# Patient Record
Sex: Male | Born: 1960 | Race: White | Hispanic: No | Marital: Married | Smoking: Never smoker
Health system: Southern US, Community
[De-identification: ages and names within clinical notes are randomized; demographics above are authoritative.]

## PROBLEM LIST (undated history)

## (undated) DIAGNOSIS — Z803 Family history of malignant neoplasm of breast: Secondary | ICD-10-CM

## (undated) DIAGNOSIS — E079 Disorder of thyroid, unspecified: Secondary | ICD-10-CM

## (undated) DIAGNOSIS — D2330 Other benign neoplasm of skin of unspecified part of face: Secondary | ICD-10-CM

## (undated) DIAGNOSIS — Z8042 Family history of malignant neoplasm of prostate: Secondary | ICD-10-CM

## (undated) HISTORY — DX: Family history of malignant neoplasm of prostate: Z80.42

## (undated) HISTORY — DX: Other benign neoplasm of skin of unspecified part of face: D23.30

## (undated) HISTORY — DX: Family history of malignant neoplasm of breast: Z80.3

---

## 2002-05-08 ENCOUNTER — Ambulatory Visit (HOSPITAL_BASED_OUTPATIENT_CLINIC_OR_DEPARTMENT_OTHER): Admission: RE | Admit: 2002-05-08 | Discharge: 2002-05-08 | Payer: Self-pay | Admitting: Orthopedic Surgery

## 2006-10-19 ENCOUNTER — Ambulatory Visit: Payer: Self-pay | Admitting: Sports Medicine

## 2006-10-19 DIAGNOSIS — M7750 Other enthesopathy of unspecified foot: Secondary | ICD-10-CM | POA: Insufficient documentation

## 2008-09-22 ENCOUNTER — Ambulatory Visit (HOSPITAL_COMMUNITY): Admission: RE | Admit: 2008-09-22 | Discharge: 2008-09-22 | Payer: Self-pay | Admitting: Sports Medicine

## 2008-09-24 ENCOUNTER — Encounter: Admission: RE | Admit: 2008-09-24 | Discharge: 2008-09-24 | Payer: Self-pay | Admitting: Family Medicine

## 2010-05-27 NOTE — Op Note (Signed)
NAME:  Jerry Hunter, Jerry Hunter                        ACCOUNT NO.:  [id number]   MEDICAL RECORD NO.:  [id number]                   PATIENT TYPE:  AMB   LOCATION:  DSC                                  FACILITY:  [facility]   PHYSICIAN:  Katy Fitch. Naaman Plummer., M.D.          DATE OF BIRTH:  1960-02-16   DATE OF PROCEDURE:  05/08/2002  DATE OF DISCHARGE:                                 OPERATIVE REPORT   PREOPERATIVE DIAGNOSIS:  Laceration, radial aspect of left index finger, on  05/04/02, with loss of sensibility of radial pulp, left index finger.   POSTOPERATIVE DIAGNOSIS:  Partial laceration of radial proper digital nerve  proximal to trifurcation and laceration of cutaneous branch along middle  phalangeal segment, left index finger.   PROCEDURE:  Exploration of left index finger radial proper digital nerve,  followed by repair of cutaneous branch and sidewall laceration of radial  proper digital nerve.   SURGEON:  Katy Fitch. Sypher, M.D.   ASSISTANT:  Jonni Sanger, P.A.   ANESTHESIA:  General by LMA, supervised by the anesthesiologist, Janetta Hora.  Gelene Mink, M.D.   INDICATIONS:  The patient is a 50 year old who was referred through the  courtesy of Pleas Patricia, M.D., for evaluation of a left index finger  laceration sustained accidentally at home on 05/04/02.   He had sustained a laceration over the middle phalangeal segment radial  aspect of left index finger and noted loss of sensibility.   The patient, being familiar with Dr. Stephens November, sought a consultation.   Dr. Stephens November cleansed the wound and repaired the wound with Steri-Strips.   Noting loss of sensibility in the radial proper digital nerve distribution,  a hand surgery consultation was requested.   On examination in the office the patient was noted to have a positive Tinel  sign at the site of his laceration and loss of sensibility to sharp, dull,  and two-point discrimination in the radial pulp.  We recommended  delayed  primary repair at this time.   DESCRIPTION OF PROCEDURE:  The patient is brought to the operating room and  placed in the supine position on the operating table.  Following induction  of general endotracheal anesthesia, the left arm was prepped with Betadine  soap and solution and sterilely draped.  A pneumatic tourniquet was applied  to the proximal brachium.   Following exsanguination of the left arm with an Esmarch bandage, an  arterial tourniquet on the proximal brachium was inflated to 230 mmHg.   The procedure commenced with extension of the traumatic oblique wound into a  Brunner zigzag incision, crossing the DIP flexion crease.   Subcutaneous tissues were carefully divided, identifying the flexor sheath.  Several cutaneous veins were electrocauterized.  The radial proper digital  nerve was identified proximally in virgin tissue and dissected distally.   There was a sidewall laceration of the radial proper digital nerve with  involvement of a large  fascicle.  There was a large approximately 1 mm  cutaneous branch that was laying radial to the radial proper digital nerve  proper that was clearly lacerated.   There appeared to be a high takeoff of the branch that served the radial  pulp rather than the central pulp.   With the aid of the operating microscope, the radial proper digital nerve  proper was repaired with epineurial suture of 9-0 nylon.  The cutaneous  branch was approximated, trimmed on the neuroma end, and repaired with two  sutures of 9-0 nylon with epineurial technique.   The wound was irrigated and repaired with interrupted sutures of 5-0 nylon.   There were no apparent complications.   The finger was dressed with Xeroflo, sterile gauze, and an Alumafoam splint  to protect the repair.   There were no apparent complications.   The patient was awakened from anesthesia and transferred to the recovery  room with stable vital signs.                                                Katy Fitch Naaman Plummer., M.D.    RVS/MEDQ  D:  05/08/2002  T:  05/09/2002  Job:  [id number]

## 2015-08-22 ENCOUNTER — Emergency Department (HOSPITAL_COMMUNITY): Payer: BLUE CROSS/BLUE SHIELD

## 2015-08-22 ENCOUNTER — Emergency Department (HOSPITAL_COMMUNITY)
Admission: EM | Admit: 2015-08-22 | Discharge: 2015-08-22 | Disposition: A | Discharge status: Home / Self Care | Payer: BLUE CROSS/BLUE SHIELD | Attending: Emergency Medicine | Admitting: Emergency Medicine

## 2015-08-22 ENCOUNTER — Encounter (HOSPITAL_COMMUNITY): Payer: Self-pay | Admitting: Emergency Medicine

## 2015-08-22 DIAGNOSIS — M545 Low back pain, unspecified: Secondary | ICD-10-CM

## 2015-08-22 DIAGNOSIS — R52 Pain, unspecified: Secondary | ICD-10-CM

## 2015-08-22 MED ORDER — CYCLOBENZAPRINE HCL 10 MG PO TABS
10.0000 mg | ORAL_TABLET | Freq: Once | ORAL | Status: AC
  Administered 2015-08-22: 10 mg via ORAL
  Filled 2015-08-22: qty 1

## 2015-08-22 MED ORDER — CYCLOBENZAPRINE HCL 10 MG PO TABS: 10.0000 mg | ORAL_TABLET | Freq: Two times a day (BID) | ORAL | 0 refills | Status: DC | PRN

## 2015-08-22 MED ORDER — IBUPROFEN 800 MG PO TABS: 800.0000 mg | ORAL_TABLET | Freq: Three times a day (TID) | ORAL | 0 refills | Status: DC

## 2015-08-22 MED ORDER — KETOROLAC TROMETHAMINE 30 MG/ML IJ SOLN
30.0000 mg | Freq: Once | INTRAMUSCULAR | Status: AC
  Administered 2015-08-22: 30 mg via INTRAVENOUS
  Filled 2015-08-22: qty 1

## 2015-08-22 NOTE — ED Provider Notes (Signed)
Emergency Department Provider Note   I have reviewed the triage vital signs and the nursing notes.   HISTORY  Chief Complaint Back Pain   HPI QAMAR BRAGA is a 55 y.o. male with PMH of lower back pain presents to the emergency department with sudden onset lower back discomfort in the setting of moving heavy objects around the attic. Patient states he was bending forward while carrying a bike trainer when he had sudden onset severe pain that he describes just to the right of his lower back and coccyx. He has had similar pain in this location before. Denies radiation of pain down the leg. No weakness or numbness in the lower extremities. Patient has had no urinary incontinence or difficulty urinating. No groin numbness. No fevers or midline back pain. Patient with a remote history of T spine compression fractures.    History reviewed. No pertinent past medical history.  Patient Active Problem List   Diagnosis Date Noted  . METATARSALGIA 10/19/2006    History reviewed. No pertinent surgical history.  Current Outpatient Rx  . Order #: [id number] Class: Historical Med  . Order #: [id number] Class: Historical Med  . Order #: [id number] Class: Print  . Order #: [id number] Class: Print    Allergies Review of patient's allergies indicates no known allergies.  No family history on file.  Social History Social History  Substance Use Topics  . Smoking status: Never Smoker  . Smokeless tobacco: Never Used  . Alcohol use No    Review of Systems  Constitutional: No fever/chills Eyes: No visual changes. ENT: No sore throat. Cardiovascular: Denies chest pain. Respiratory: Denies shortness of breath. Gastrointestinal: No abdominal pain.  No nausea, no vomiting.  No diarrhea.  No constipation. Genitourinary: Negative for dysuria. Musculoskeletal: Positive for back pain.  Skin: Negative for rash. Neurological: Negative for headaches, focal weakness or numbness.  10-point ROS  otherwise negative.  ____________________________________________   PHYSICAL EXAM:  VITAL SIGNS: ED Triage Vitals [08/22/15 1032]  Enc Vitals Group     BP 109/72     Pulse Rate (!) 55     Resp 18     Temp 97.7 F (36.5 C)     Temp Source Oral     SpO2 99 %     Pain Score 8   Constitutional: Alert and oriented. Well appearing and in no acute distress. Eyes: Conjunctivae are normal. PERRL. EOMI. Head: Atraumatic. Nose: No congestion/rhinnorhea. Mouth/Throat: Mucous membranes are moist.  Oropharynx non-erythematous. Neck: No stridor.  No meningeal signs. No cervical spine tenderness to palpation Cardiovascular: Normal rate, regular rhythm. Good peripheral circulation. Grossly normal heart sounds.   Respiratory: Normal respiratory effort.  No retractions. Lungs CTAB. Gastrointestinal: Soft and nontender. No distention.  Musculoskeletal: No lower extremity tenderness nor edema. No gross deformities of extremities. No midline spine tenderness. Mild tenderness to palpation along the right lateral spine.  Neurologic:  Normal speech and language. No gross focal neurologic deficits are appreciated.  Skin:  Skin is warm, dry and intact. No rash noted. Psychiatric: Mood and affect are normal. Speech and behavior are normal.  ____________________________________________  RADIOLOGY  Dg Lumbar Spine Complete  Result Date: 08/22/2015 CLINICAL DATA:  Low back pain after lifting injury this morning. EXAM: LUMBAR SPINE - COMPLETE 4+ VIEW COMPARISON:  None. FINDINGS: This report assumes 5 non rib-bearing lumbar vertebrae. Lumbar vertebral body heights are preserved, with no fracture. Lumbar disc heights are preserved. No spondylosis. No spondylolisthesis. No appreciable facet arthropathy. No aggressive appearing focal osseous lesions.  Right upper quadrant surgical clips. IMPRESSION: No lumbar spine fracture or spondylolisthesis. Electronically Signed   By: Ilona Sorrel M.D.   On: 08/22/2015 11:47      ____________________________________________   PROCEDURES  Procedure(s) performed:   Procedures  None ____________________________________________   INITIAL IMPRESSION / ASSESSMENT AND PLAN / ED COURSE  Pertinent labs & imaging results that were available during my care of the patient were reviewed by me and considered in my medical decision making (see chart for details).  Patient resents to the emergency department for evaluation of acute onset lower back pain in the setting of lifting. No red flag symptoms to make me suspicious for cauda equina or other spinal cord emergency. The patient has an intact neurological exam with some mild tenderness to his right lateral lumbar spine. Low suspicion for fracture. Plan for plain films of the lumbar spine and will give Toradol and Flexeril for pain control. Suspect musculoskeletal etiology. Discussed this with the patient in detail.   12:19 PM Patient pain somewhat improved. X-ray negative. Plan for discharge home with Motrin and Flexeril. Advised early walking and movement. He will follow with his primary care physician. Discussed return precautions in detail and answered any questions. ____________________________________________  FINAL CLINICAL IMPRESSION(S) / ED DIAGNOSES  Final diagnoses:  Right-sided low back pain without sciatica     MEDICATIONS GIVEN DURING THIS VISIT:  Medications  ketorolac (TORADOL) 30 MG/ML injection 30 mg (30 mg Intravenous Given 08/22/15 1121)  cyclobenzaprine (FLEXERIL) tablet 10 mg (10 mg Oral Given 08/22/15 1121)     NEW OUTPATIENT MEDICATIONS STARTED DURING THIS VISIT:  New Prescriptions   CYCLOBENZAPRINE (FLEXERIL) 10 MG TABLET    Take 1 tablet (10 mg total) by mouth 2 (two) times daily as needed for muscle spasms.   IBUPROFEN (ADVIL,MOTRIN) 800 MG TABLET    Take 1 tablet (800 mg total) by mouth 3 (three) times daily.      Note:  This document was prepared using Dragon voice  recognition software and may include unintentional dictation errors.  Nanda Quinton, MD Emergency Medicine   Margette Fast, MD 08/22/15 [phone]

## 2015-08-22 NOTE — ED Triage Notes (Signed)
Patient here from home with complaints of lower back pain. Reports cleaning out attic, heard a loud pop when bending over. Given 150 Fent, 4mg  Zofran.

## 2015-08-22 NOTE — Discharge Instructions (Signed)
You have been seen in the Emergency Department (ED)  today for back pain.  Your workup and exam have not shown any acute abnormalities and you are likely suffering from muscle strain or possible problems with your discs, but there is no treatment that will fix your symptoms at this time.  Please take Motrin (ibuprofen) as needed for your pain according to the instructions written on the box.  Alternatively, for the next five days you can take 800mg three times daily with meals (it may upset your stomach).   Please follow up with your doctor as soon as possible regarding today's ED visit and your back pain.  Return to the ED for worsening back pain, fever, weakness or numbness of either leg, or if you develop either (1) an inability to urinate or have bowel movements, or (2) loss of your ability to control your bathroom functions (if you start having "accidents"), or if you develop other new symptoms that concern you.  

## 2017-04-27 IMAGING — CR DG LUMBAR SPINE COMPLETE 4+V
5 series · 5 of 5 positions shown · non-contrast
Comparison: None.

CLINICAL DATA: Low back pain after lifting injury this morning.

EXAM:
LUMBAR SPINE - COMPLETE 4+ VIEW

[t lumbar spine ap (1 of 2)]
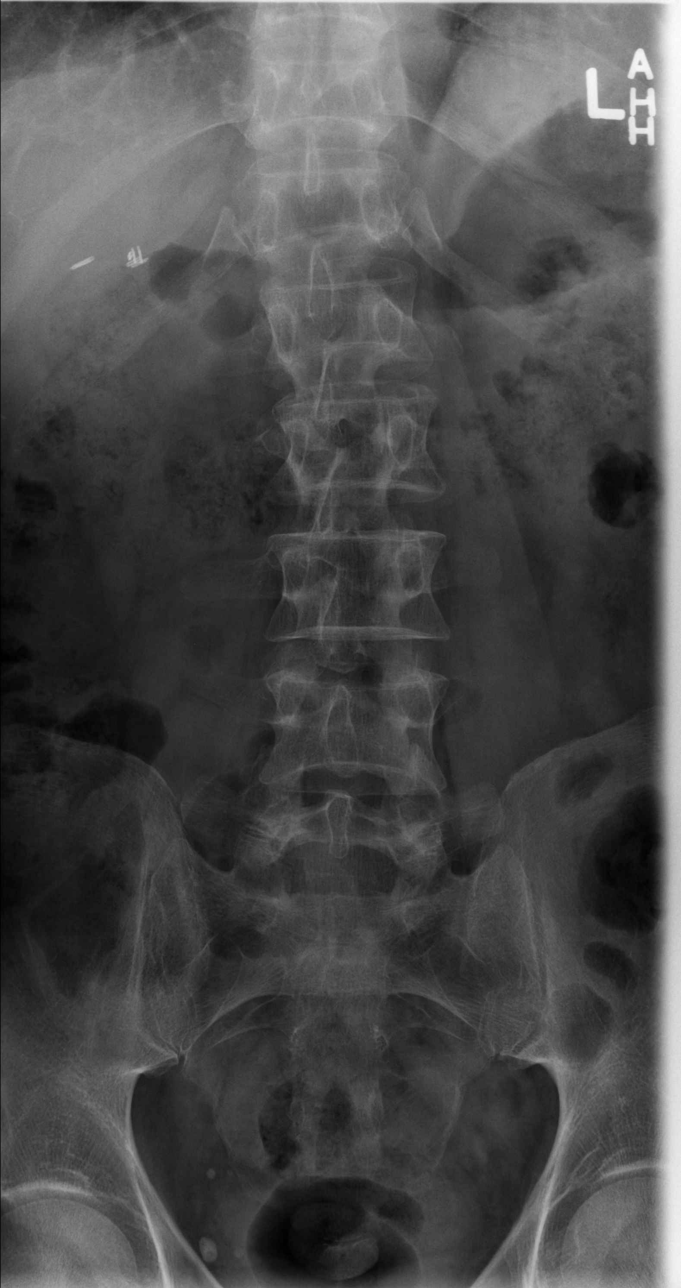

[t lumbar spine ap (2 of 2)]
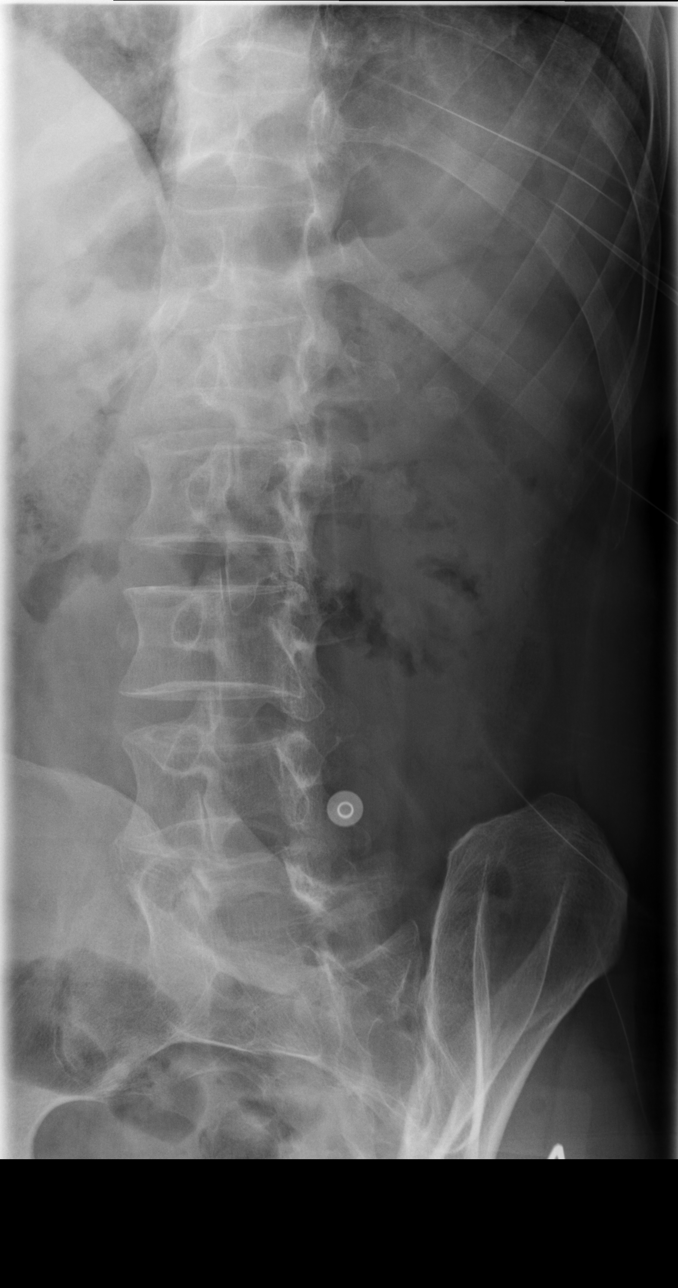

[t lumbar spine lat (1 of 2)]
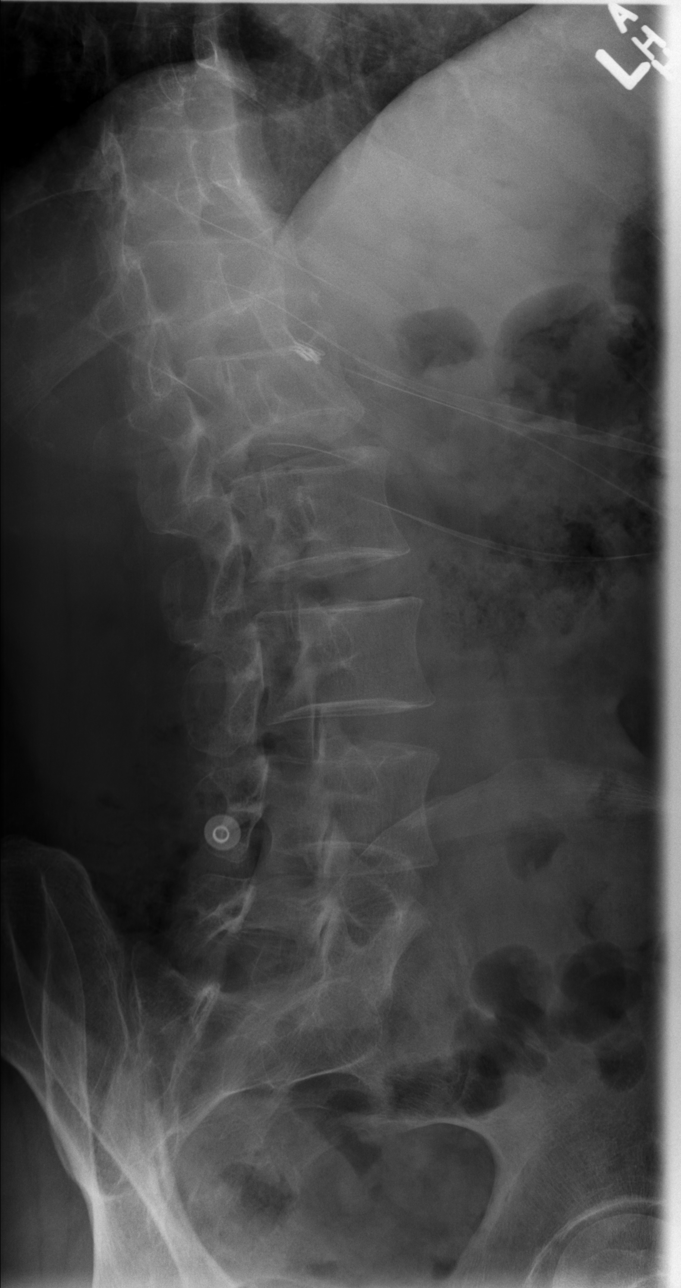

[t lumbar spine lat (2 of 2)]
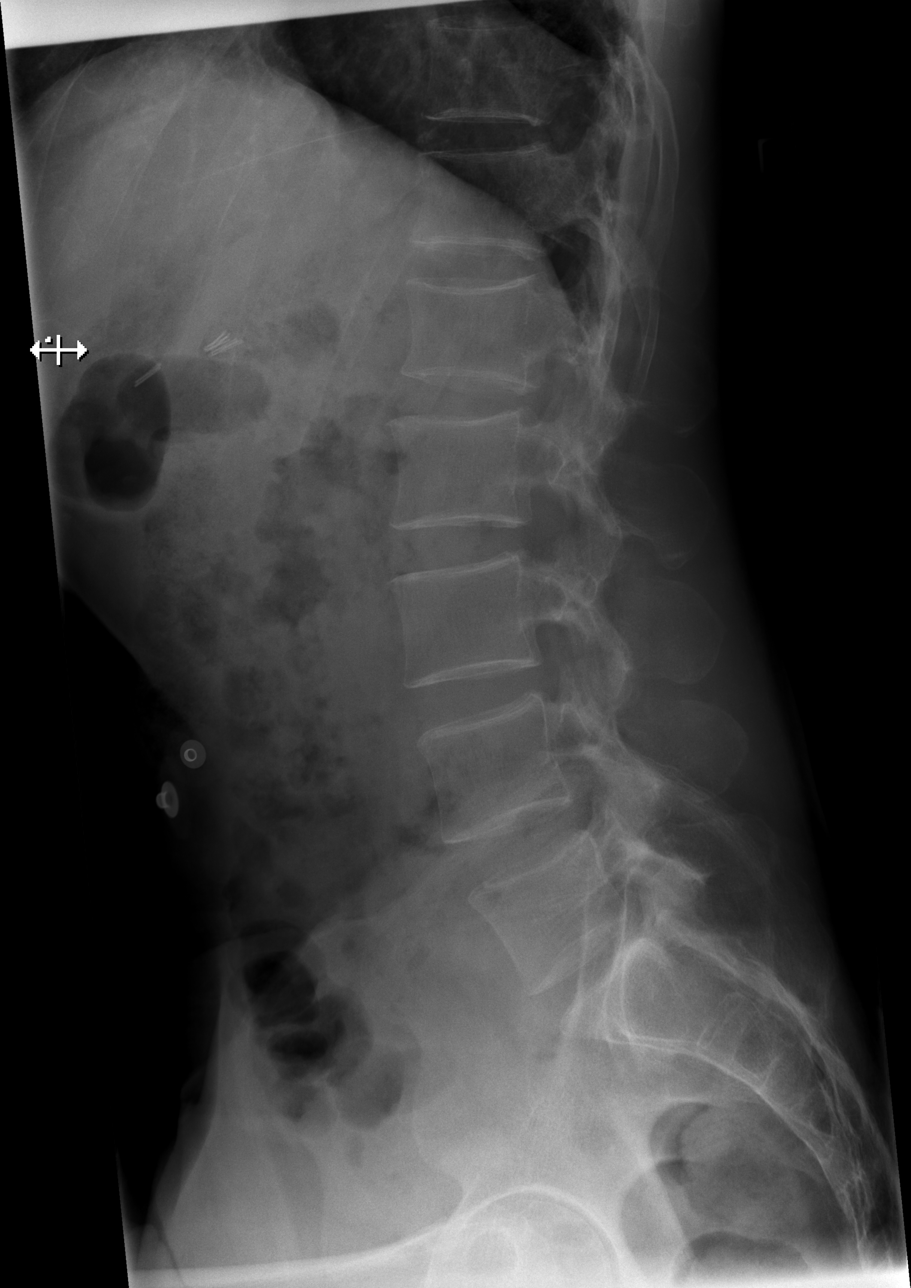

[t lumbar l-5 s-1 spot]
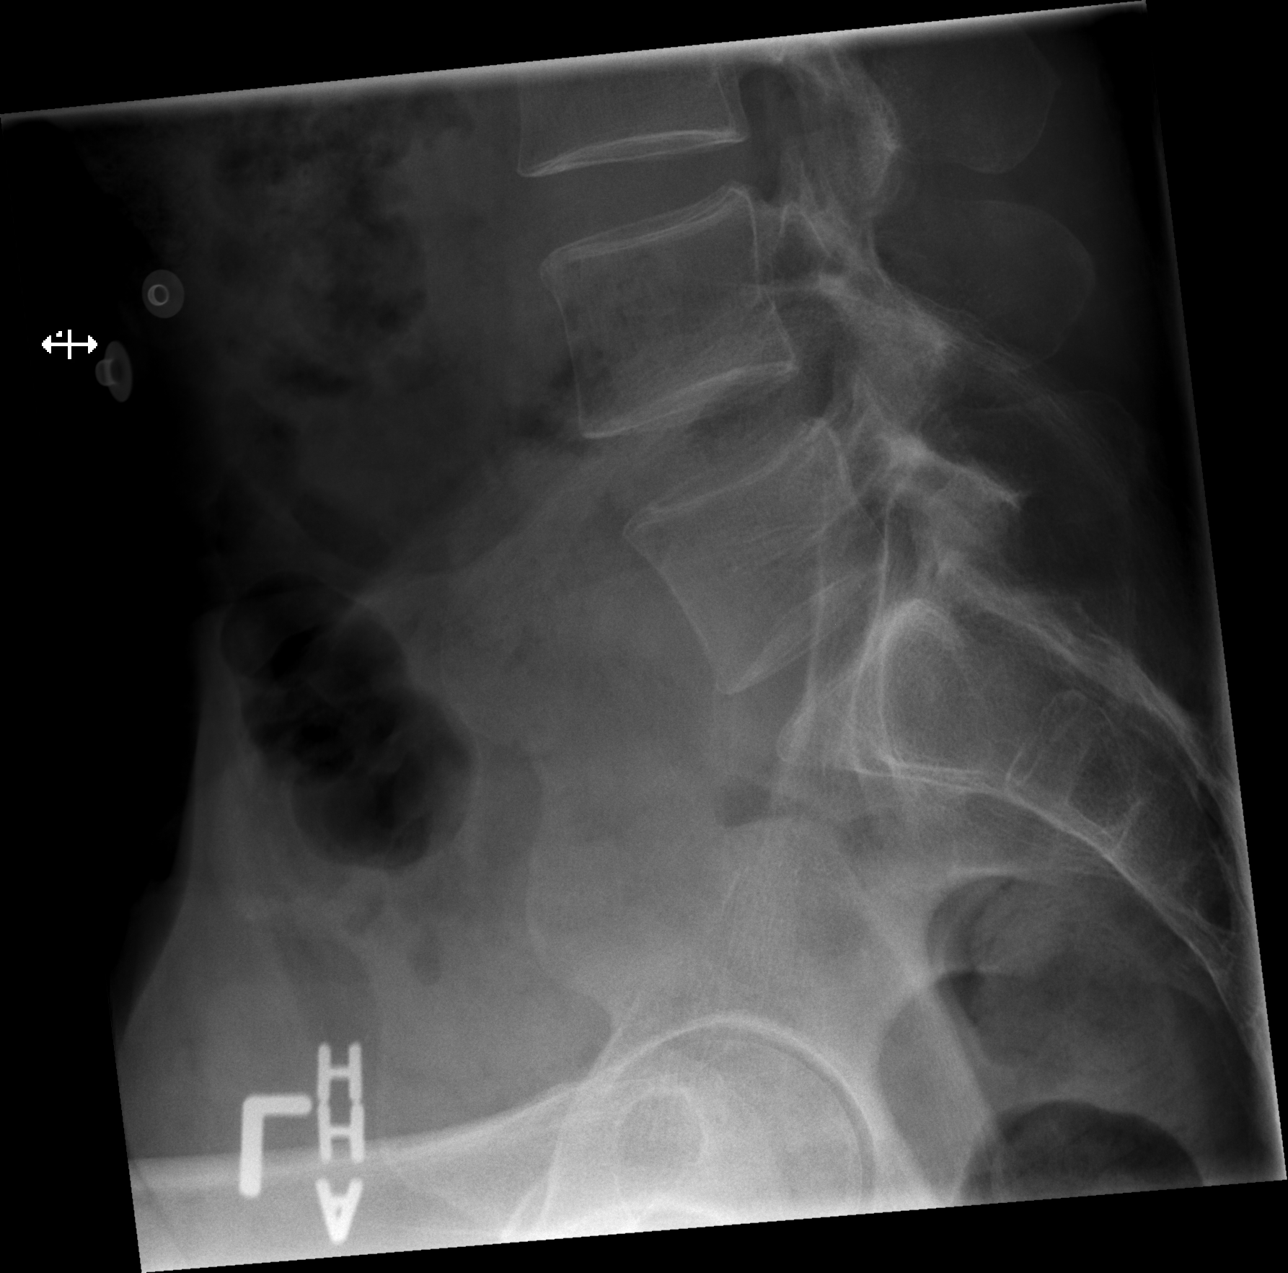

[5 of 5 positions shown; findings below may reference images not displayed]

FINDINGS: This report assumes 5 non rib-bearing lumbar vertebrae.

Lumbar vertebral body heights are preserved, with no fracture.

Lumbar disc heights are preserved. No spondylosis. No
spondylolisthesis. No appreciable facet arthropathy. No aggressive
appearing focal osseous lesions. Right upper quadrant surgical
clips.
IMPRESSION: No lumbar spine fracture or spondylolisthesis.

## 2019-05-13 ENCOUNTER — Telehealth: Payer: Self-pay | Admitting: Genetic Counselor

## 2019-05-13 NOTE — Telephone Encounter (Signed)
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 Mr. Grzybowski has been cld and scheduled to see Santiago Glad on 5/12 at 2pm. Pt aware to arrive 15 minutes early.

## 2019-05-21 ENCOUNTER — Other Ambulatory Visit: Payer: Self-pay | Admitting: Genetic Counselor

## 2019-05-21 ENCOUNTER — Inpatient Hospital Stay: Payer: BC Managed Care – PPO

## 2019-05-21 ENCOUNTER — Other Ambulatory Visit: Payer: Self-pay

## 2019-05-21 ENCOUNTER — Inpatient Hospital Stay: Payer: BC Managed Care – PPO | Attending: Genetic Counselor | Admitting: Genetic Counselor

## 2019-05-21 ENCOUNTER — Encounter: Payer: Self-pay | Admitting: Genetic Counselor

## 2019-05-21 DIAGNOSIS — D2330 Other benign neoplasm of skin of unspecified part of face: Secondary | ICD-10-CM

## 2019-05-21 DIAGNOSIS — Z1379 Encounter for other screening for genetic and chromosomal anomalies: Secondary | ICD-10-CM

## 2019-05-21 DIAGNOSIS — Z8042 Family history of malignant neoplasm of prostate: Secondary | ICD-10-CM | POA: Insufficient documentation

## 2019-05-21 DIAGNOSIS — Z803 Family history of malignant neoplasm of breast: Secondary | ICD-10-CM | POA: Insufficient documentation

## 2019-05-21 NOTE — Progress Notes (Signed)
REFERRING PROVIDER: Celedonio Savage, PA-C [address],  [place] [address]  PRIMARY PROVIDER:  Christain Sacramento, MD  PRIMARY REASON FOR VISIT:  1. Sebaceous adenoma of face   2. Family history of breast cancer   3. Family history of prostate cancer      HISTORY OF PRESENT ILLNESS:   Mr. Panik, a 59 y.o. male, was seen for a  cancer genetics consultation at the request of Celedonio Savage due to a family history of cancer and a personal history of sebaceous adenoma.  Mr. Baumgardner presents to clinic today to discuss the possibility of a hereditary predisposition to cancer, genetic testing, and to further clarify his future cancer risks, as well as potential cancer risks for family members.   Mr. Thueson is a 59 y.o. male with no personal history of cancer.  He reports having a colonoscopy at age 60 that may have found one polyp.  He remains on a 10 year colonoscopy schedule.  CANCER HISTORY:  Oncology History   No history exists.      Past Medical History:  Diagnosis Date  . Family history of breast cancer   . Family history of prostate cancer   . Sebaceous adenoma of face     No past surgical history on file.  Social History   Socioeconomic History  . Marital status: Married    Spouse name: Not on file  . Number of children: Not on file  . Years of education: Not on file  . Highest education level: Not on file  Occupational History  . Not on file  Tobacco Use  . Smoking status: Never Smoker  . Smokeless tobacco: Never Used  Substance and Sexual Activity  . Alcohol use: No  . Drug use: Not on file  . Sexual activity: Not on file  Other Topics Concern  . Not on file  Social History Narrative  . Not on file   Social Determinants of Health   Financial Resource Strain:   . Difficulty of Paying Living Expenses:   Food Insecurity:   . Worried About Charity fundraiser in the Last Year:   . Arboriculturist in the Last Year:    Transportation Needs:   . Film/video editor (Medical):   Marland Kitchen Lack of Transportation (Non-Medical):   Physical Activity:   . Days of Exercise per Week:   . Minutes of Exercise per Session:   Stress:   . Feeling of Stress :   Social Connections:   . Frequency of Communication with Friends and Family:   . Frequency of Social Gatherings with Friends and Family:   . Attends Religious Services:   . Active Member of Clubs or Organizations:   . Attends Archivist Meetings:   Marland Kitchen Marital Status:      FAMILY HISTORY:  We obtained a detailed, 4-generation family history.  Significant diagnoses are listed below: Family History  Problem Relation Age of Onset  . Breast cancer Mother 79  . Lung cancer Mother 7  . Dementia Father   . Throat cancer Brother 48  . Scleroderma Brother 74  . Prostate cancer Brother 35  . Dementia Maternal Uncle   . Dementia Paternal Aunt   . Leukemia Paternal Uncle 94  . Dementia Maternal Grandmother   . Stroke Maternal Grandmother   . Stroke Maternal Grandfather   . Heart attack Paternal Grandmother 65  . Dementia Paternal Grandfather   . Osteosarcoma Cousin 50  maternal first cousin  . Throat cancer Cousin        pat first cousin    The patient has a biological son and daughter and an adopted daughter who are all cancer free.  He has three brothers, one who had throat cancer and prostate cancer and died at 35.  Both parents are deceased.  The patient's mother had breast cancer at 75.  She had a brother and sister, the brother had a son who had an osteosarcoma at 57.  There is no other reported family history of cancer.  The patient's father died of dementia.  He had a brother and sister, the brother had leukemia at 31 and the sister had a son with throat cancer in his 66s.  There is no other reported family history of cancer.  Mr. Burch is unaware of previous family history of genetic testing for hereditary cancer risks. Patient's  maternal ancestors are of [place] descent, and paternal ancestors are of [place] descent. There is no reported Ashkenazi Jewish ancestry. There is no known consanguinity.  GENETIC COUNSELING ASSESSMENT: Mr. Hari is a 59 y.o. male with a family history of cancer and personal history of sebaceous adenoma which is somewhat suggestive of a hereditary cancer syndrome and predisposition to cancer given the rare sebaceous adenoma and family history of cancer. We, therefore, discussed and recommended the following at today's visit.   DISCUSSION: We discussed that sebaceous carcinomas are rare cancers that begin in an oil or sweat gland. While these types of cancers can occur sporadically, they can also be associated with Lynch syndrome.  According to Hauser, about 2% (64 out of 3299) of patients with sebaceous skin lesions between 2000-2012 had Muir-Torre syndrome (Lynch syndrome) (PMID [id number]). Mr. Popwell does not have a family history that is consistent with Lynch syndrome, however, the diagnosis of a sebaceous adenoma alone with a family history of breast and prostate cancer, suggests that further evaluation is warranted.  We reviewed the characteristics, features and inheritance patterns of hereditary cancer syndromes. We also discussed genetic testing, including the appropriate family members to test, the process of testing, insurance coverage and turn-around-time for results. We discussed the implications of a negative, positive and/or variant of uncertain significant result. We recommended Mr. Molina pursue genetic testing for the common hereditary gene panel. The Common Hereditary Gene Panel offered by [facility] includes sequencing and/or deletion duplication testing of the following 48 genes: APC, ATM, AXIN2, BARD1, BMPR1A, BRCA1, BRCA2, BRIP1, CDH1, CDK4, CDKN2A (p14ARF), CDKN2A (p16INK4a), CHEK2, CTNNA1, DICER1, EPCAM (Deletion/duplication testing only), GREM1 (promoter region  deletion/duplication testing only), KIT, MEN1, MLH1, MSH2, MSH3, MSH6, MUTYH, NBN, NF1, NHTL1, PALB2, PDGFRA, PMS2, POLD1, POLE, PTEN, RAD50, RAD51C, RAD51D, RNF43, SDHB, SDHC, SDHD, SMAD4, SMARCA4. STK11, TP53, TSC1, TSC2, and VHL.  The following genes were evaluated for sequence changes only: SDHA and HOXB13 c.251G>A variant only.    We discussed that based on Mr. Hegg's personal and family history of cancer, he does not meet medical criteria for genetic testing. Despite that he does not meet criteria, it would still be appropriate to test him due to his sebaceous adenoma.  Sebaceous adenomas, while associated with Lynch syndrome, are not part of the major criteria for determine medical necessity.  We can try to go through insurance to see if they will cover testing, and if they do not, he would have an out of pocket cost of $250.     PLAN: After considering the risks, benefits, and limitations,  Mr. Merlos provided informed consent to pursue genetic testing and the blood sample was sent to [facility] for analysis of the common hereditary cancer panel. Results should be available within approximately 2-3 weeks' time, at which point they will be disclosed by telephone to Mr. Kinchen, as will any additional recommendations warranted by these results. Mr. Nocito will receive a summary of his genetic counseling visit and a copy of his results once available. This information will also be available in Epic.   Lastly, we encouraged Mr. Bueso to remain in contact with cancer genetics annually so that we can continuously update the family history and inform him of any changes in cancer genetics and testing that may be of benefit for this family.   Mr. Hodges's questions were answered to his satisfaction today. Our contact information was provided should additional questions or concerns arise. Thank you for the referral and allowing [place] to share in the care of your patient.   Yovany Clock P.  [place]ne [place], [place], [facility] Licensed, Insurance risk surveyor [email or web address] phone: [phone]  The patient was seen for a total of 45 minutes in face-to-face genetic counseling.  This patient was discussed with Drs. Magrinat, Lindi Adie and/or Burr Medico who agrees with the above.    _______________________________________________________________________ For Office Staff:  Number of people involved in session: 1 Was an Intern/ student involved with case: no

## 2019-06-04 ENCOUNTER — Telehealth: Payer: Self-pay | Admitting: Genetic Counselor

## 2019-06-04 ENCOUNTER — Encounter: Payer: Self-pay | Admitting: Genetic Counselor

## 2019-06-04 DIAGNOSIS — Z1379 Encounter for other screening for genetic and chromosomal anomalies: Secondary | ICD-10-CM | POA: Insufficient documentation

## 2019-06-04 NOTE — Telephone Encounter (Signed)
LM on VM that results were back and to please call. ?

## 2019-06-10 ENCOUNTER — Ambulatory Visit: Payer: Self-pay | Admitting: Genetic Counselor

## 2019-06-10 DIAGNOSIS — Z1379 Encounter for other screening for genetic and chromosomal anomalies: Secondary | ICD-10-CM

## 2019-06-10 DIAGNOSIS — D2330 Other benign neoplasm of skin of unspecified part of face: Secondary | ICD-10-CM

## 2019-06-10 NOTE — Progress Notes (Signed)
HPI:  Mr. Yusuf was previously seen in the [place] clinic due to a personal and family history of cancer and concerns regarding a hereditary predisposition to cancer. Please refer to our prior cancer genetics clinic note for more information regarding our discussion, assessment and recommendations, at the time. Mr. Plemmons's recent genetic test results were disclosed to him, as were recommendations warranted by these results. These results and recommendations are discussed in more detail below.  CANCER HISTORY:  Oncology History   No history exists.    FAMILY HISTORY:  We obtained a detailed, 4-generation family history.  Significant diagnoses are listed below: Family History  Problem Relation Age of Onset  . Breast cancer Mother 8  . Lung cancer Mother 62  . Dementia Father   . Throat cancer Brother 54  . Scleroderma Brother 66  . Prostate cancer Brother 17  . Dementia Maternal Uncle   . Dementia Paternal Aunt   . Leukemia Paternal Uncle 2  . Dementia Maternal Grandmother   . Stroke Maternal Grandmother   . Stroke Maternal Grandfather   . Heart attack Paternal Grandmother 8  . Dementia Paternal Grandfather   . Osteosarcoma Cousin 19       maternal first cousin  . Throat cancer Cousin        pat first cousin    The patient has a biological son and daughter and an adopted daughter who are all cancer free.  He has three brothers, one who had throat cancer and prostate cancer and died at 10.  Both parents are deceased.  The patient's mother had breast cancer at 38.  She had a brother and sister, the brother had a son who had an osteosarcoma at 3.  There is no other reported family history of cancer.  The patient's father died of dementia.  He had a brother and sister, the brother had leukemia at 5 and the sister had a son with throat cancer in his 60's.  There is no other reported family history of cancer.  Mr. Chiaramonte is unaware of previous  family history of genetic testing for hereditary cancer risks. Patient's maternal ancestors are of [place] descent, and paternal ancestors are of [place] descent. There is no reported Ashkenazi Jewish ancestry. There is no known consanguinity.   GENETIC TEST RESULTS: Genetic testing reported out on Jun 03, 2019 through the common hereditary cancer panel found no pathogenic mutations. The Common Hereditary Gene Panel offered by [facility] includes sequencing and/or deletion duplication testing of the following 48 genes: APC, ATM, AXIN2, BARD1, BMPR1A, BRCA1, BRCA2, BRIP1, CDH1, CDK4, CDKN2A (p14ARF), CDKN2A (p16INK4a), CHEK2, CTNNA1, DICER1, EPCAM (Deletion/duplication testing only), GREM1 (promoter region deletion/duplication testing only), KIT, MEN1, MLH1, MSH2, MSH3, MSH6, MUTYH, NBN, NF1, NHTL1, PALB2, PDGFRA, PMS2, POLD1, POLE, PTEN, RAD50, RAD51C, RAD51D, RNF43, SDHB, SDHC, SDHD, SMAD4, SMARCA4. STK11, TP53, TSC1, TSC2, and VHL.  The following genes were evaluated for sequence changes only: SDHA and HOXB13 c.251G>A variant only. The test report has been scanned into EPIC and is located under the Molecular Pathology section of the Results Review tab.  A portion of the result report is included below for reference.     We discussed with Mr. Horsey that because current genetic testing is not perfect, it is possible there may be a gene mutation in one of these genes that current testing cannot detect, but that chance is small.  We also discussed, that there could be another gene that has not yet been discovered,  or that we have not yet tested, that is responsible for the cancer diagnoses in the family. It is also possible there is a hereditary cause for the cancer in the family that Mr. Scheiderer did not inherit and therefore was not identified in his testing.  Therefore, it is important to remain in touch with cancer genetics in the future so that we can continue to offer Mr. Teo the most up to  date genetic testing.   ADDITIONAL GENETIC TESTING: We discussed with Mr. Allsup that there are other genes that are associated with increased cancer risk that can be analyzed. Should Mr. Mischke wish to pursue additional genetic testing, we are happy to discuss and coordinate this testing, at any time.    CANCER SCREENING RECOMMENDATIONS: Mr. Laitinen's test result is considered negative (normal).  This means that we have not identified a hereditary cause for his personal and family history of cancer at this time. Most cancers happen by chance and this negative test suggests that his cancer may fall into this category.    While reassuring, this does not definitively rule out a hereditary predisposition to cancer. It is still possible that there could be genetic mutations that are undetectable by current technology. There could be genetic mutations in genes that have not been tested or identified to increase cancer risk.  Therefore, it is recommended he continue to follow the cancer management and screening guidelines provided by his primary healthcare provider.   An individual's cancer risk and medical management are not determined by genetic test results alone. Overall cancer risk assessment incorporates additional factors, including personal medical history, family history, and any available genetic information that may result in a personalized plan for cancer prevention and surveillance  RECOMMENDATIONS FOR FAMILY MEMBERS:  Individuals in this family might be at some increased risk of developing cancer, over the general population risk, simply due to the family history of cancer.  We recommended women in this family have a yearly mammogram beginning at age 62, or 38 years younger than the earliest onset of cancer, an annual clinical breast exam, and perform monthly breast self-exams. Women in this family should also have a gynecological exam as recommended by their primary provider. All family  members should have a colonoscopy by age 81.  FOLLOW-UP: Lastly, we discussed with Mr. Montenegro that cancer genetics is a rapidly advancing field and it is possible that new genetic tests will be appropriate for him and/or his family members in the future. We encouraged him to remain in contact with cancer genetics on an annual basis so we can update his personal and family histories and let him know of advances in cancer genetics that may benefit this family.   Our contact number was provided. Mr. Drennan's questions were answered to his satisfaction, and he knows he is welcome to call us at anytime with additional questions or concerns.   Roma Kayser, [place], [facility] Licensed, Certified Genetic Counselor [email or web address]

## 2019-06-10 NOTE — Telephone Encounter (Signed)
Revealed negative genetic testing.  Discussed that we do not know why he has a sebaceous adenoma or why there is cancer in the family. It could be due to a different gene that we are not testing, or maybe our current technology may not be able to pick something up.  It will be important for him to keep in contact with genetics to keep up with whether additional testing may be needed.

## 2020-01-14 ENCOUNTER — Other Ambulatory Visit: Payer: Self-pay | Admitting: Orthopedic Surgery

## 2020-01-14 DIAGNOSIS — M67912 Unspecified disorder of synovium and tendon, left shoulder: Secondary | ICD-10-CM

## 2021-04-03 ENCOUNTER — Encounter (HOSPITAL_BASED_OUTPATIENT_CLINIC_OR_DEPARTMENT_OTHER): Payer: Self-pay | Admitting: Obstetrics and Gynecology

## 2021-04-03 ENCOUNTER — Other Ambulatory Visit: Payer: Self-pay

## 2021-04-03 ENCOUNTER — Emergency Department (HOSPITAL_BASED_OUTPATIENT_CLINIC_OR_DEPARTMENT_OTHER)
Admission: EM | Admit: 2021-04-03 | Discharge: 2021-04-03 | Disposition: A | Discharge status: Home / Self Care | Payer: BC Managed Care – PPO | Attending: Emergency Medicine | Admitting: Emergency Medicine

## 2021-04-03 DIAGNOSIS — H539 Unspecified visual disturbance: Secondary | ICD-10-CM

## 2021-04-03 DIAGNOSIS — H538 Other visual disturbances: Secondary | ICD-10-CM | POA: Insufficient documentation

## 2021-04-03 HISTORY — DX: Disorder of thyroid, unspecified: E07.9

## 2021-04-03 NOTE — ED Triage Notes (Signed)
Patient reports to the ER for having lightning streaks around right eye in the periphery. Denies pain or visual field change. Patient reports he has no pain.  ?

## 2021-04-03 NOTE — Discharge Instructions (Addendum)
Please go directly to the ophthalmology office listed above to see Dr. Talbert Forest.  He will perform a full exam at that time.  When you get there, the doors will be locked.  Please call [phone] and he will come to let you in.  ? ?Please drive directly to the office and do not make any stops. ?

## 2021-04-03 NOTE — ED Provider Notes (Signed)
? ?Emergency Department Provider Note ? ? ?I have reviewed the triage vital signs and the nursing notes. ? ? ?HISTORY ? ?Chief Complaint ?Eye Problem ? ? ?HPI ?Jerry Hunter is a 61 y.o. male with PMH reviewed presents to the emergency department with sparks/flashes around the peripheral vision of the right eye.  Patient noticed symptoms this morning.  He is not having any loss of vision, eye pain, drainage.  No eye injury.  He is not experiencing associated headache.  No pain with extraocular movement.  No prior history of retina issue or eye surgery.  He does wear glasses but otherwise little associated eye history.  ? ? ?Past Medical History:  ?Diagnosis Date  ? Family history of breast cancer   ? Family history of prostate cancer   ? Sebaceous adenoma of face   ? Thyroid disease   ? ? ?Review of Systems ? ?Constitutional: No fever/chills ?Eyes: No loss of vision but sparks in the peripheral vision.  ?Neurological: Negative for headaches. ? ? ?____________________________________________ ? ? ?PHYSICAL EXAM: ? ?VITAL SIGNS: ?ED Triage Vitals  ?Enc Vitals Group  ?   BP 04/03/21 0834 117/70  ?   Pulse Rate 04/03/21 0834 (!) 59  ?   Resp 04/03/21 0834 14  ?   Temp 04/03/21 0834 98.4 ?F (36.9 ?C)  ?   Temp src --   ?   SpO2 04/03/21 0834 100 %  ? ? ?Constitutional: Alert and oriented. Well appearing and in no acute distress. ?Eyes: Conjunctivae are normal. PERRL. EOMI. Pupils are reactive.  Nondilated ophthalmoscope exam shows grossly normal retina.  ?Head: Atraumatic. ?Nose: No congestion/rhinnorhea. ?Mouth/Throat: Mucous membranes are moist.   ?Neck: No stridor.  ?Cardiovascular: Good peripheral circulation.    ?Respiratory: Normal respiratory effort.  ?Gastrointestinal: No distention.  ?Musculoskeletal: No gross deformities of extremities. ?Neurologic:  Normal speech and language.  ?Skin:  Skin is warm, dry and intact. No rash  noted. ? ? ?____________________________________________ ? ? ?PROCEDURES ? ?Procedure(s) performed:  ? ?Procedures ? ?None  ?____________________________________________ ? ? ?INITIAL IMPRESSION / ASSESSMENT AND PLAN / ED COURSE ? ?Pertinent labs & imaging results that were available during my care of the patient were reviewed by me and considered in my medical decision making (see chart for details). ?  ?This patient is Presenting for Evaluation of vision change, which does require a range of treatment options, and is a complaint that involves a high risk of morbidity and mortality. ? ?The Differential Diagnoses include retinal detachment, migraine aura, glaucoma, corneal abrasion ? ? ?I decided to review pertinent External Data, and in summary no recent ED visits. ? ?Consult complete with Dr. Talbert Forest with Ophthalmology. Plan for the patient to drive directly to his office for prompt evaluation. Patient has someone to drive him.  ? ?Medical Decision Making: Summary:  ?The patient presents emergency department with flashes in the peripheral vision of the right eye.  No curtain or loss of vision fields.  On exam patient's visual fields are grossly intact.  No eye pain or visual acuity difference. Will discuss with ophthalmology on call.  ? ?Reevaluation with update and discussion with patient to discuss plan for d/c to ophthalmology office. Will go directly there.  ? ?Disposition: discharge ? ?____________________________________________ ? ?FINAL CLINICAL IMPRESSION(S) / ED DIAGNOSES ? ?Final diagnoses:  ?Change in vision  ? ? ? ?Note:  This document was prepared using Dragon voice recognition software and may include unintentional dictation errors. ? ?Nanda Quinton, MD, FACEP ?Emergency Medicine ? ?  ?  Margette Fast, MD ?04/03/21 0901 ? ?

## 2021-04-03 NOTE — ED Notes (Signed)
Patient instructed to go directly to ophthmology  ?

## 2023-10-30 ENCOUNTER — Other Ambulatory Visit (HOSPITAL_BASED_OUTPATIENT_CLINIC_OR_DEPARTMENT_OTHER): Payer: Self-pay

## 2023-10-30 MED ORDER — FLUZONE 0.5 ML IM SUSY
0.5000 mL | PREFILLED_SYRINGE | Freq: Once | INTRAMUSCULAR | 0 refills | Status: AC
  Filled 2023-10-30: qty 0.5, 1d supply, fill #0

## 2024-01-17 ENCOUNTER — Encounter: Payer: Self-pay | Admitting: Podiatry

## 2024-01-17 ENCOUNTER — Ambulatory Visit: Admitting: Podiatry

## 2024-01-17 ENCOUNTER — Ambulatory Visit (INDEPENDENT_AMBULATORY_CARE_PROVIDER_SITE_OTHER)

## 2024-01-17 DIAGNOSIS — S9032XA Contusion of left foot, initial encounter: Secondary | ICD-10-CM

## 2024-01-17 NOTE — Progress Notes (Signed)
 Subjective:   Patient ID: Jerry Hunter, male   DOB: 64 y.o.   MRN: [id number]   HPI Patient presents stating he is getting a lot of shooting burning pain in his left foot between 3rd and 4th toes that is been going on for about a year gradually getting worse and making shoe gear difficult has tried shoe gear accommodations to control this but is gradually becoming more painful.  Patient also traumatized the left hallux nail and likes to be very active does not smoke   Review of Systems  All other systems reviewed and are negative.       Objective:  Physical Exam Vitals and nursing note reviewed.  Constitutional:      Appearance: He is well-developed.  Pulmonary:     Effort: Pulmonary effort is normal.  Musculoskeletal:        General: Normal range of motion.  Skin:    General: Skin is warm.  Neurological:     Mental Status: He is alert.     Neurovascular status intact muscle strength adequate range of motion within normal limits with patient noted to have exquisite discomfort third interspace left with radiating discomfort into the adjacent digits with shooting like pain.  I could feel a nodule and there is a positive Mulder sign when pressed     Assessment:  Strong probability for neuroma symptomatology left does not appear to be related to joint or other bone or tissue pathology     Plan:  H&P reviewed in great length went over different treatment options conservative and surgical.  Patient would like to get this fixed and given the year-long duration of symptoms I do think that is an appropriate thing for him to do and at this point I allowed him to read consent form line by line going over the surgery and correction.  Scheduled for outpatient surgery all questions answered today and patient will have this done on an outpatient basis.  Encouraged to call with any questions prior to procedure  X-rays indicate there is no signs of arthritis or stress fracture associated  with the pain the patient is experiencing

## 2024-01-17 NOTE — Patient Instructions (Signed)

## 2024-02-06 ENCOUNTER — Telehealth: Payer: Self-pay | Admitting: Podiatry

## 2024-02-06 NOTE — Telephone Encounter (Signed)
 Called to schedule patients surgery and he wanted to know the expected recovery time and how long he'll be in the boot. Patient tentatively scheduled fort 2/17 as of now

## 2024-02-27 ENCOUNTER — Ambulatory Visit (HOSPITAL_BASED_OUTPATIENT_CLINIC_OR_DEPARTMENT_OTHER): Admitting: Physical Therapy

## 2024-03-05 ENCOUNTER — Encounter (HOSPITAL_BASED_OUTPATIENT_CLINIC_OR_DEPARTMENT_OTHER): Payer: Self-pay | Admitting: Physical Therapy

## 2024-03-12 ENCOUNTER — Encounter (HOSPITAL_BASED_OUTPATIENT_CLINIC_OR_DEPARTMENT_OTHER): Payer: Self-pay | Admitting: Physical Therapy
